# Patient Record
Sex: Female | Born: 1960 | Race: White | Hispanic: No | Smoking: Never smoker
Health system: Southern US, Community
[De-identification: ages and names within clinical notes are randomized; demographics above are authoritative.]

## PROBLEM LIST (undated history)

## (undated) HISTORY — PX: ABDOMINAL HYSTERECTOMY: SHX81

## (undated) HISTORY — PX: WRIST SURGERY: SHX841

---

## 2009-05-16 ENCOUNTER — Inpatient Hospital Stay (HOSPITAL_COMMUNITY): Admission: EM | Admit: 2009-05-16 | Discharge: 2009-05-19 | Payer: Self-pay | Admitting: Emergency Medicine

## 2009-06-28 ENCOUNTER — Encounter: Admission: RE | Admit: 2009-06-28 | Discharge: 2009-09-26 | Payer: Self-pay | Admitting: Orthopedic Surgery

## 2010-07-16 LAB — CBC
HCT: 38.1 % (ref 36.0–46.0)
MCHC: 34.9 g/dL (ref 30.0–36.0)
Platelets: 278 10*3/uL (ref 150–400)
RDW: 12 % (ref 11.5–15.5)

## 2010-07-16 LAB — DIFFERENTIAL
Basophils Relative: 0 % (ref 0–1)
Eosinophils Absolute: 0 10*3/uL (ref 0.0–0.7)
Eosinophils Relative: 0 % (ref 0–5)
Lymphocytes Relative: 11 % — ABNORMAL LOW (ref 12–46)
Lymphs Abs: 0.9 10*3/uL (ref 0.7–4.0)
Monocytes Absolute: 0.3 10*3/uL (ref 0.1–1.0)
Monocytes Relative: 4 % (ref 3–12)
Neutro Abs: 6.9 10*3/uL (ref 1.7–7.7)

## 2010-07-16 LAB — BASIC METABOLIC PANEL
GFR calc Af Amer: >60 mL/min (ref >60)
Glucose, Bld: 112 mg/dL — ABNORMAL HIGH (ref 70–99)
Sodium: 138 mEq/L (ref 135–145)

## 2010-07-16 LAB — URINALYSIS, ROUTINE W REFLEX MICROSCOPIC
Protein, ur: NEGATIVE mg/dL
Urobilinogen, UA: 0.2 mg/dL (ref 0.0–1.0)

## 2011-02-16 IMAGING — CR DG CHEST 2V
2 series · 2 of 2 positions shown · non-contrast
Comparison: None

CLINICAL DATA: Wrist fracture.  Preop respiratory exam.

CHEST - 2 VIEW

[w chest pa]
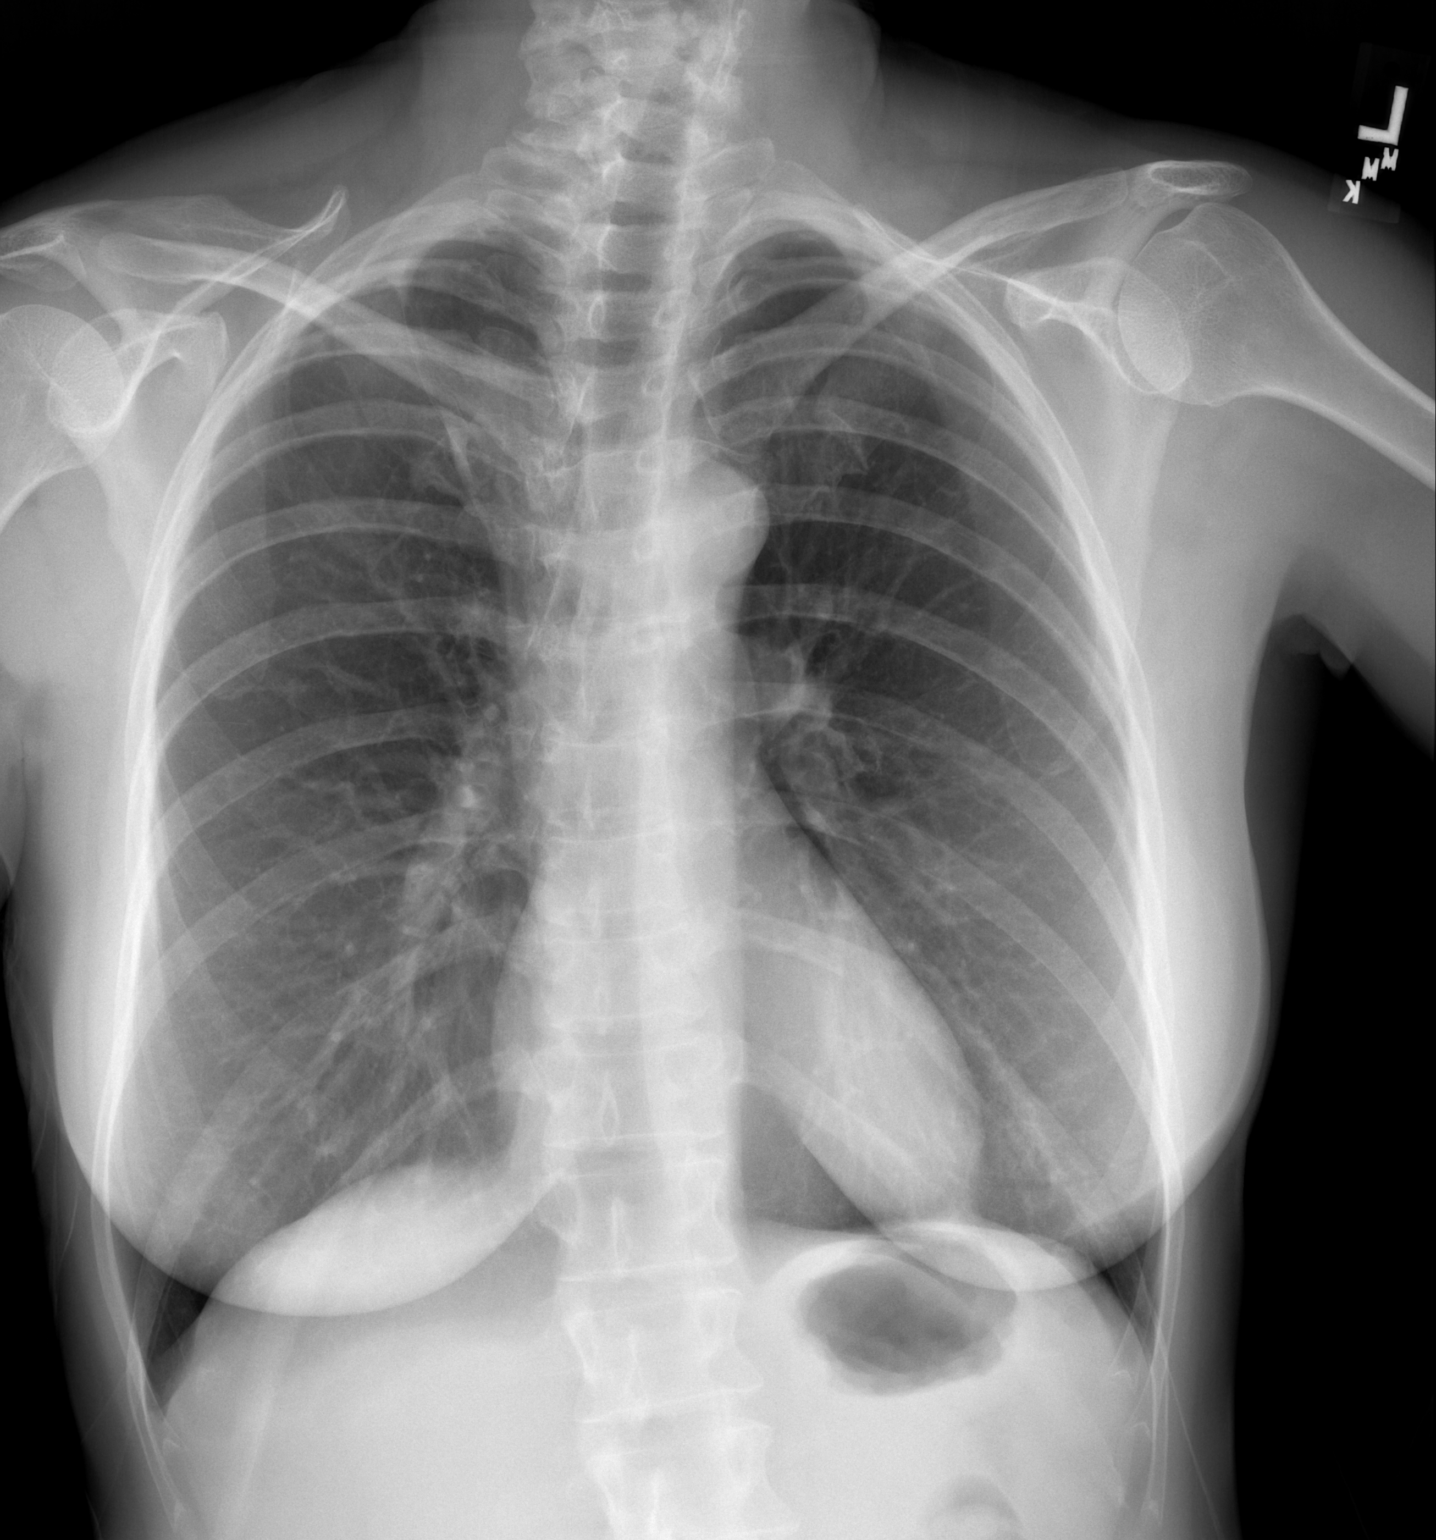

[w chest lat]
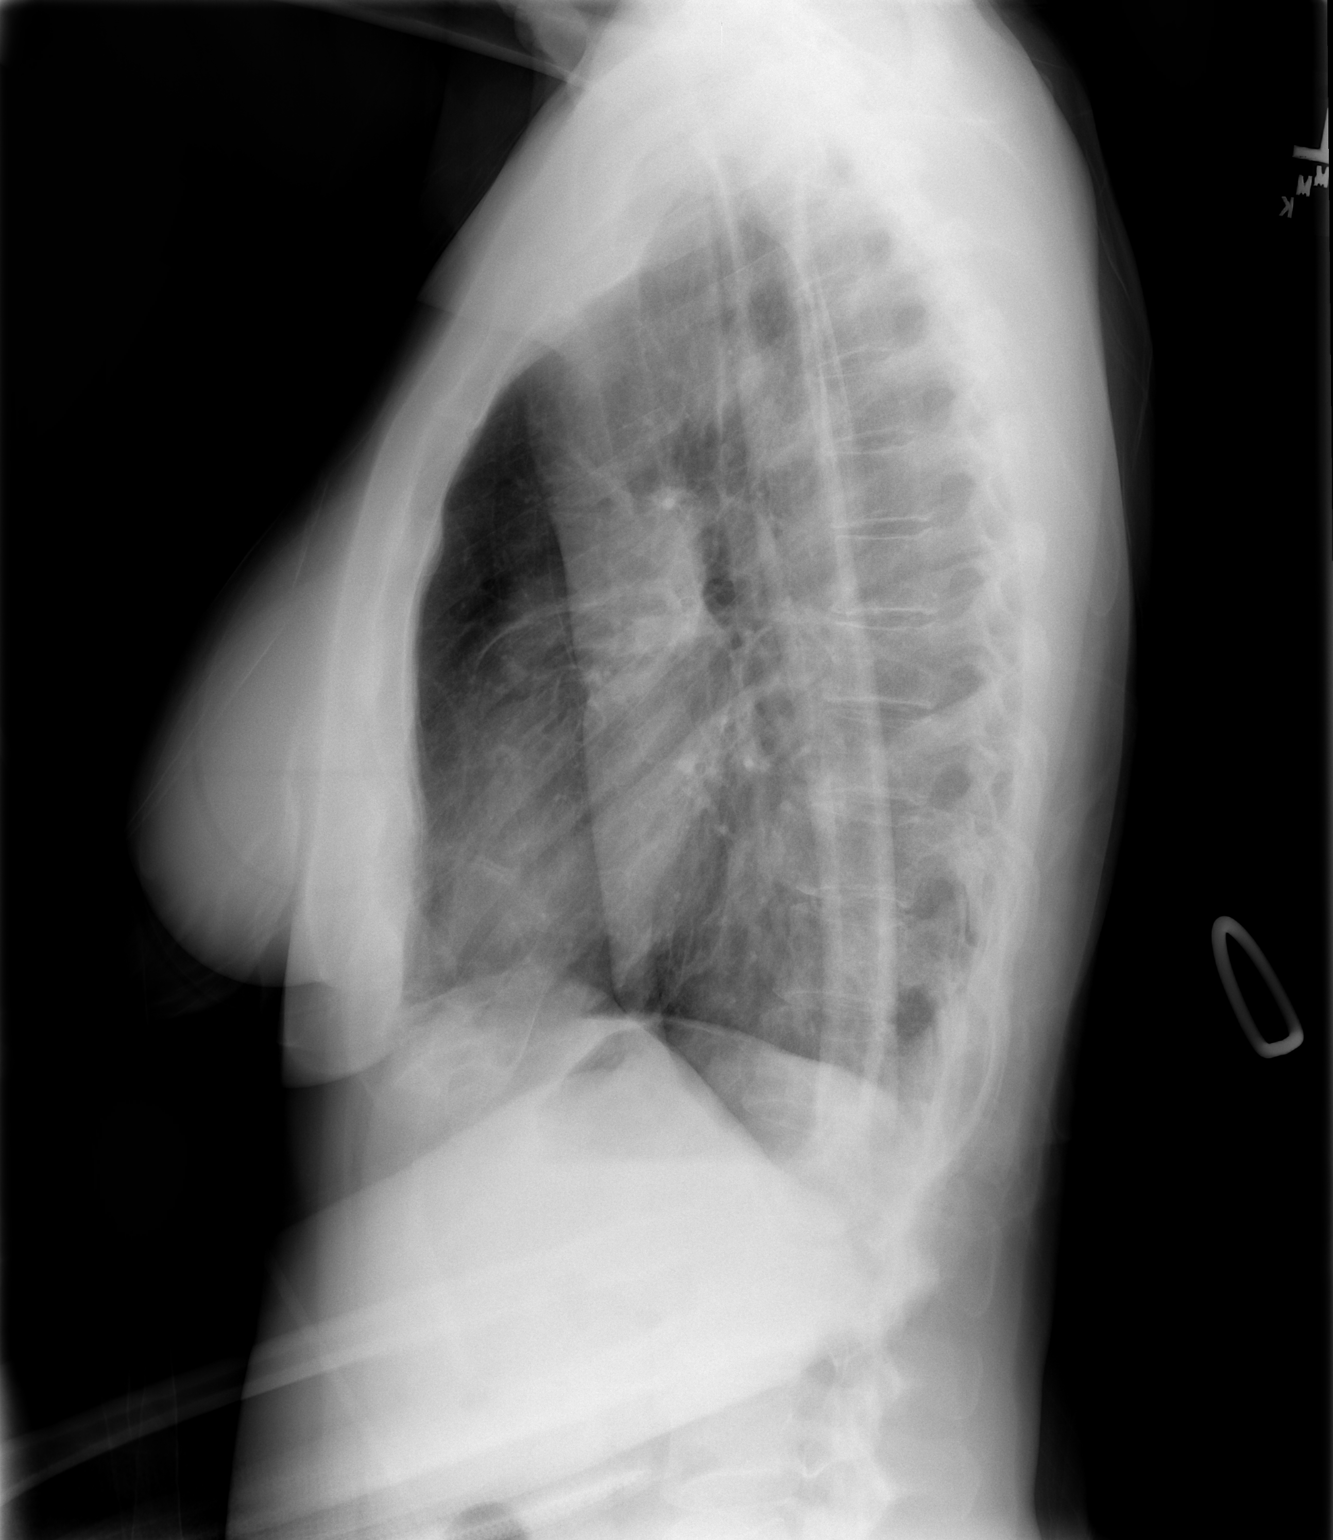

[2 of 2 positions shown; findings below may reference images not displayed]

FINDINGS: Heart size mediastinal contours are normal.  Both lungs
are clear.  Incidental note is made of an azygos fissure.  No
evidence of pneumothorax or pleural effusion.
IMPRESSION: No active cardiopulmonary disease.

## 2013-06-24 ENCOUNTER — Ambulatory Visit (INDEPENDENT_AMBULATORY_CARE_PROVIDER_SITE_OTHER): Payer: 59 | Admitting: Podiatry

## 2013-06-24 ENCOUNTER — Ambulatory Visit (INDEPENDENT_AMBULATORY_CARE_PROVIDER_SITE_OTHER): Payer: 59

## 2013-06-24 ENCOUNTER — Encounter: Payer: Self-pay | Admitting: Podiatry

## 2013-06-24 VITALS — BP 131/88 | HR 92 | Resp 16 | Ht 63.0 in | Wt 112.0 lb

## 2013-06-24 DIAGNOSIS — M2020 Hallux rigidus, unspecified foot: Secondary | ICD-10-CM

## 2013-06-24 DIAGNOSIS — M2010 Hallux valgus (acquired), unspecified foot: Secondary | ICD-10-CM

## 2013-06-24 NOTE — Progress Notes (Signed)
   Subjective:    Patient ID: Michele LaddBetty Salsgiver, female    DOB: 14-Jan-1961, 53 y.o.   MRN: [id number]  HPI Comments: "I have a painful bunion"  Patient c/o painful 1st MPJ bilateral, right over left, for several years. Just recently the right has become worse. The area is red and swells some. 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 Her shoes are sometimes uncomfortable. She has tried ice and Advil.  Foot Pain      Review of Systems  All other systems reviewed and are negative.       Objective:   Physical Exam        Assessment & Plan:

## 2013-06-24 NOTE — Patient Instructions (Signed)

## 2013-06-25 NOTE — Progress Notes (Signed)
Subjective:     Patient ID: Michele Hardy, female   DOB: 07-11-1960, 53 y.o.   MRN: [id number]  Foot Pain   patient presents with complaint of painful bunion deformity right over left foot it is becoming increasingly red and sore. 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 Has had trouble with her foot for a fairly long time and it has worsened recently right over left   Review of Systems  All other systems reviewed and are negative.       Objective:   Physical Exam  Nursing note and vitals reviewed. Constitutional: She is oriented to person, place, and time.  Musculoskeletal: Normal range of motion.  Neurological: She is oriented to person, place, and time.  Skin: Skin is warm.   neurovascular status is intact with normal muscle strength range of motion and no equinus condition noted. Toes are warm and structure shows bunion deformity right over left was swelling around the right first MPJ and dorsal pain upon palpation. 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 I explained to the patient the condition that she has and the discomfort she is experiencing is and discussed the different treatment options. Patient wants surgery for this and we discussed a biplanar Austin bunionectomy with removal of bone spurs and possible subchondral bone drilling. I did explain the possibilities for joint fusion or implantation in the future and that she does have a progressive type condition. She is completely aware of this and wants surgery and at this time was given consent form that she read [facility] going over all possible complications and the fact that total recovery. Take 6 months to one year. Patient  was dispensed air fracture walker to use during the postoperative process

## 2013-06-29 ENCOUNTER — Encounter: Payer: Self-pay | Admitting: Podiatry

## 2013-06-29 DIAGNOSIS — M2020 Hallux rigidus, unspecified foot: Secondary | ICD-10-CM

## 2013-07-05 ENCOUNTER — Ambulatory Visit (INDEPENDENT_AMBULATORY_CARE_PROVIDER_SITE_OTHER): Payer: 59 | Admitting: Podiatry

## 2013-07-05 ENCOUNTER — Ambulatory Visit (INDEPENDENT_AMBULATORY_CARE_PROVIDER_SITE_OTHER): Payer: 59

## 2013-07-05 ENCOUNTER — Encounter: Payer: Self-pay | Admitting: Podiatry

## 2013-07-05 VITALS — BP 130/88 | HR 92 | Resp 16

## 2013-07-05 DIAGNOSIS — Z9889 Other specified postprocedural states: Secondary | ICD-10-CM

## 2013-07-05 DIAGNOSIS — M2020 Hallux rigidus, unspecified foot: Secondary | ICD-10-CM

## 2013-07-05 NOTE — Progress Notes (Signed)
Subjective:     Patient ID: Michele Hardy, female   DOB: [birth year], 53 y.o.   MRN: [id number]  HPI patient states I'm having some pain around the big toe joint right but overall it feels okay and I'm very happy with my surgery. 6 days after having biplanar osteotomy first metatarsal right   Review of Systems     Objective:   Physical Exam Neurovascular status intact negative Homans sign noted. The first MPJ right is doing very well with mild edema no erythema no drainage wound edges well coapted and good dorsi and plantar flexion    Assessment:     Patient is doing well post biplanar osteotomy first metatarsal right    Plan:     Reviewed x-rays and dispensed surgical shoe. Allow patient to get foot wet without putting pressure on it and explained range of motion exercises. Reappoint in 3 weeks for recheck or earlier if needed

## 2013-07-05 NOTE — Progress Notes (Unsigned)
Surgery performed by Dr. Charlsie Merlesegal - right bi-planar Eliberto IvoryAustin bunionectomy with pin fixation.

## 2013-08-02 ENCOUNTER — Encounter: Payer: 59 | Admitting: Podiatry

## 2013-08-12 ENCOUNTER — Ambulatory Visit (INDEPENDENT_AMBULATORY_CARE_PROVIDER_SITE_OTHER): Payer: 59 | Admitting: Podiatry

## 2013-08-12 ENCOUNTER — Ambulatory Visit (INDEPENDENT_AMBULATORY_CARE_PROVIDER_SITE_OTHER): Payer: 59

## 2013-08-12 ENCOUNTER — Encounter: Payer: Self-pay | Admitting: Podiatry

## 2013-08-12 VITALS — BP 158/82 | HR 74 | Resp 16

## 2013-08-12 DIAGNOSIS — M2010 Hallux valgus (acquired), unspecified foot: Secondary | ICD-10-CM

## 2013-08-13 NOTE — Progress Notes (Signed)
Subjective:     Patient ID: Michele LaddBetty Wolk, female   DOB: Jul 09, 1960, 53 y.o.   MRN: [id number]  HPI patient states that she is doing very well with her surgery and satisfied with the correction and the fact that she can walk with minimal swelling   Review of Systems     Objective:   Physical Exam Neurovascular status intact negative Homans sign noted with well-healing surgical site right first metatarsal with good alignment noted and good range of motion with 30 dorsiflexion 20 plantar flexion with no pain or crepitus within the joint    Assessment:     Doing well from structural surgery of the right foot with mild edema at    Plan:     Reviewed x-ray explained that mild edema still normal for this type of surgery and allow patient to increase activities and shoe gear to toleration and reappoint 8 weeks earlier if any issues should occur

## 2013-09-23 ENCOUNTER — Ambulatory Visit (INDEPENDENT_AMBULATORY_CARE_PROVIDER_SITE_OTHER): Payer: 59 | Admitting: Podiatry

## 2013-09-23 ENCOUNTER — Ambulatory Visit (INDEPENDENT_AMBULATORY_CARE_PROVIDER_SITE_OTHER): Payer: 59

## 2013-09-23 ENCOUNTER — Encounter: Payer: Self-pay | Admitting: Podiatry

## 2013-09-23 VITALS — BP 136/89 | HR 79 | Resp 14 | Ht 63.0 in | Wt 110.0 lb

## 2013-09-23 DIAGNOSIS — M21619 Bunion of unspecified foot: Secondary | ICD-10-CM

## 2013-09-23 DIAGNOSIS — M2020 Hallux rigidus, unspecified foot: Secondary | ICD-10-CM

## 2013-09-23 NOTE — Progress Notes (Signed)
Subjective:     Patient ID: Michele Hardy, female   DOB: 1960-11-22, 53 y.o.   MRN: [id number]  HPI patient states I'm doing really well with my right foot   Review of Systems     Objective:   Physical Exam Neurovascular status intact with well-healing surgical site right approximate 8 weeks postop biplanar osteotomy first metatarsal    Assessment:     Doing well after having Austin osteotomy right foot    Plan:     Advised on physical therapy and to return to normal activity reviewed x-rays and reappoint as needed

## 2014-02-11 ENCOUNTER — Other Ambulatory Visit: Payer: Self-pay

## 2018-02-25 DIAGNOSIS — Z23 Encounter for immunization: Secondary | ICD-10-CM | POA: Diagnosis not present

## 2018-04-02 DIAGNOSIS — Z1159 Encounter for screening for other viral diseases: Secondary | ICD-10-CM | POA: Diagnosis not present

## 2018-04-02 DIAGNOSIS — Z13220 Encounter for screening for lipoid disorders: Secondary | ICD-10-CM | POA: Diagnosis not present

## 2018-04-02 DIAGNOSIS — Z0000 Encounter for general adult medical examination without abnormal findings: Secondary | ICD-10-CM | POA: Diagnosis not present

## 2018-05-01 DIAGNOSIS — M8589 Other specified disorders of bone density and structure, multiple sites: Secondary | ICD-10-CM | POA: Diagnosis not present

## 2018-05-01 DIAGNOSIS — Z90710 Acquired absence of both cervix and uterus: Secondary | ICD-10-CM | POA: Diagnosis not present

## 2018-05-01 DIAGNOSIS — Z780 Asymptomatic menopausal state: Secondary | ICD-10-CM | POA: Diagnosis not present

## 2018-05-01 DIAGNOSIS — Z1231 Encounter for screening mammogram for malignant neoplasm of breast: Secondary | ICD-10-CM | POA: Diagnosis not present

## 2018-05-01 DIAGNOSIS — Z8262 Family history of osteoporosis: Secondary | ICD-10-CM | POA: Diagnosis not present

## 2018-07-06 DIAGNOSIS — D649 Anemia, unspecified: Secondary | ICD-10-CM | POA: Diagnosis not present

## 2018-10-12 DIAGNOSIS — D649 Anemia, unspecified: Secondary | ICD-10-CM | POA: Diagnosis not present

## 2019-02-01 ENCOUNTER — Other Ambulatory Visit: Payer: Self-pay | Admitting: *Deleted

## 2019-02-01 ENCOUNTER — Telehealth: Payer: Self-pay | Admitting: Nurse Practitioner

## 2019-02-01 DIAGNOSIS — Z20822 Contact with and (suspected) exposure to covid-19: Secondary | ICD-10-CM

## 2019-02-01 DIAGNOSIS — R05 Cough: Secondary | ICD-10-CM

## 2019-02-01 DIAGNOSIS — Z20828 Contact with and (suspected) exposure to other viral communicable diseases: Secondary | ICD-10-CM

## 2019-02-01 DIAGNOSIS — R059 Cough, unspecified: Secondary | ICD-10-CM

## 2019-02-01 MED ORDER — BENZONATATE 100 MG PO CAPS: 100.0000 mg | ORAL_CAPSULE | Freq: Three times a day (TID) | ORAL | 0 refills | Status: DC | PRN

## 2019-02-01 NOTE — Progress Notes (Signed)
E-Visit for Corona Virus Screening   Your current symptoms could be consistent with the coronavirus.  Many health care providers can now test patients at their office but not all are.  [facility] has multiple testing sites. For information on our COVID testing locations and hours go to [email or web address]  Please quarantine yourself while awaiting your test results.  We are enrolling you in our MyChart Home [facility] for COVID19 . Daily you will receive a questionnaire within the MyChart website. Our COVID 19 response team willl be monitoriing your responses daily.  You can go to one of the  testing sites listed below, while they are opened (see hours). You do not need a doctors order to be tested for covid.You do need to self-isolate until your results return and if positive 14 days from when your symptoms started and until you are 3 days symptom free.   Testing Locations (Monday - Friday, 8 a.m. - 3:30 p.m.) . [place]: [facility], [address], [place], [place]  . [place]: [facility], [address], [place], [place] (entrance off [address])  . [place]: [address], [place], [place] (across from [place] [facility])    COVID-19 is a respiratory illness with symptoms that are similar to the flu. Symptoms are typically mild to moderate, but there have been cases of severe illness and death due to the virus. The following symptoms may appear 2-14 days after exposure: . Fever . Cough . Shortness of breath or difficulty breathing . Chills . Repeated shaking with chills . Muscle pain . Headache . Sore throat . New loss of taste or smell . Fatigue . Congestion or runny nose . Nausea or vomiting . Diarrhea  It is vitally important that if you feel that you have an infection such as this virus or any other virus that you stay home and away from places where you may  spread it to others.  You should self-quarantine for 14 days if you have symptoms that could potentially be coronavirus or have been in close contact a with a person diagnosed with COVID-19 within the last 2 weeks. You should avoid contact with people age 65 and older.   You should wear a mask or cloth face covering over your nose and mouth if you must be around other people or animals, including pets (even at home). Try to stay at least 6 feet away from other people. This will protect the people around you.  You can use medication such as A prescription cough medication called Tessalon Perles 100 mg. You may take 1-2 capsules every 8 hours as needed for cough  You may also take acetaminophen (Tylenol) as needed for fever.   Reduce your risk of any infection by using the same precautions used for avoiding the common cold or flu:  . Wash your hands often with soap and warm water for at least 20 seconds.  If soap and water are not readily available, use an alcohol-based hand sanitizer with at least 60% alcohol.  . If coughing or sneezing, cover your mouth and nose by coughing or sneezing into the elbow areas of your shirt or coat, into a tissue or into your sleeve (not your hands). . Avoid shaking hands with others and consider head nods or verbal greetings only. . Avoid touching your eyes, nose, or mouth with unwashed hands.  . Avoid close contact with people who are sick. . Avoid places or   events with large numbers of people in one location, like concerts or sporting events. . Carefully consider travel plans you have or are making. . If you are planning any travel outside or inside the [place], visit the CDC's Travelers' Health webpage for the latest health notices. . If you have some symptoms but not all symptoms, continue to monitor at home and seek medical attention if your symptoms worsen. . If you are having a medical emergency, call 911.  HOME CARE . Only take medications as instructed by your  medical team. . Drink plenty of fluids and get plenty of rest. . A steam or ultrasonic humidifier can help if you have congestion.   GET HELP RIGHT AWAY IF YOU HAVE EMERGENCY WARNING SIGNS** FOR COVID-19. If you or someone is showing any of these signs seek emergency medical care immediately. Call 911 or proceed to your closest emergency facility if: . You develop worsening high fever. . Trouble breathing . Bluish lips or face . Persistent pain or pressure in the chest . New confusion . Inability to wake or stay awake . You cough up blood. . Your symptoms become more severe  **This list is not all possible symptoms. Contact your medical provider for any symptoms that are sever or concerning to you.   MAKE SURE YOU   Understand these instructions.  Will watch your condition.  Will get help right away if you are not doing well or get worse.  Your e-visit answers were reviewed by a board certified advanced clinical practitioner to complete your personal care plan.  Depending on the condition, your plan could have included both over the counter or prescription medications.  If there is a problem please reply once you have received a response from your provider.  Your safety is important to us.  If you have drug allergies check your prescription carefully.    You can use MyChart to ask questions about today's visit, request a non-urgent call back, or ask for a work or school excuse for 24 hours related to this e-Visit. If it has been greater than 24 hours you will need to follow up with your provider, or enter a new e-Visit to address those concerns. You will get an e-mail in the next two days asking about your experience.  I hope that your e-visit has been valuable and will speed your recovery. Thank you for using e-visits.   5-10 minutes spent reviewing and documenting in chart.  

## 2019-02-02 ENCOUNTER — Encounter (INDEPENDENT_AMBULATORY_CARE_PROVIDER_SITE_OTHER): Payer: Self-pay

## 2019-02-02 LAB — NOVEL CORONAVIRUS, NAA: SARS-CoV-2, NAA: NOT DETECTED

## 2019-02-03 ENCOUNTER — Encounter (INDEPENDENT_AMBULATORY_CARE_PROVIDER_SITE_OTHER): Payer: Self-pay

## 2019-02-04 ENCOUNTER — Encounter (INDEPENDENT_AMBULATORY_CARE_PROVIDER_SITE_OTHER): Payer: Self-pay

## 2019-02-05 ENCOUNTER — Encounter (INDEPENDENT_AMBULATORY_CARE_PROVIDER_SITE_OTHER): Payer: Self-pay

## 2019-02-06 ENCOUNTER — Encounter (INDEPENDENT_AMBULATORY_CARE_PROVIDER_SITE_OTHER): Payer: Self-pay

## 2019-02-07 ENCOUNTER — Encounter (INDEPENDENT_AMBULATORY_CARE_PROVIDER_SITE_OTHER): Payer: Self-pay

## 2019-02-08 ENCOUNTER — Encounter (INDEPENDENT_AMBULATORY_CARE_PROVIDER_SITE_OTHER): Payer: Self-pay

## 2019-02-09 ENCOUNTER — Encounter (INDEPENDENT_AMBULATORY_CARE_PROVIDER_SITE_OTHER): Payer: Self-pay

## 2019-02-10 ENCOUNTER — Encounter (INDEPENDENT_AMBULATORY_CARE_PROVIDER_SITE_OTHER): Payer: Self-pay

## 2019-02-11 ENCOUNTER — Encounter (INDEPENDENT_AMBULATORY_CARE_PROVIDER_SITE_OTHER): Payer: Self-pay

## 2019-02-12 ENCOUNTER — Encounter (INDEPENDENT_AMBULATORY_CARE_PROVIDER_SITE_OTHER): Payer: Self-pay

## 2019-02-14 ENCOUNTER — Encounter (INDEPENDENT_AMBULATORY_CARE_PROVIDER_SITE_OTHER): Payer: Self-pay

## 2019-05-04 DIAGNOSIS — Z0000 Encounter for general adult medical examination without abnormal findings: Secondary | ICD-10-CM | POA: Diagnosis not present

## 2019-05-04 DIAGNOSIS — Z13220 Encounter for screening for lipoid disorders: Secondary | ICD-10-CM | POA: Diagnosis not present

## 2019-05-14 DIAGNOSIS — Z1231 Encounter for screening mammogram for malignant neoplasm of breast: Secondary | ICD-10-CM | POA: Diagnosis not present

## 2020-05-19 DIAGNOSIS — Z1231 Encounter for screening mammogram for malignant neoplasm of breast: Secondary | ICD-10-CM | POA: Diagnosis not present

## 2020-07-03 DIAGNOSIS — L570 Actinic keratosis: Secondary | ICD-10-CM | POA: Diagnosis not present

## 2020-07-03 DIAGNOSIS — L400 Psoriasis vulgaris: Secondary | ICD-10-CM | POA: Diagnosis not present

## 2020-07-03 DIAGNOSIS — C44519 Basal cell carcinoma of skin of other part of trunk: Secondary | ICD-10-CM | POA: Diagnosis not present

## 2020-07-03 DIAGNOSIS — Z85828 Personal history of other malignant neoplasm of skin: Secondary | ICD-10-CM | POA: Diagnosis not present

## 2020-07-03 DIAGNOSIS — D485 Neoplasm of uncertain behavior of skin: Secondary | ICD-10-CM | POA: Diagnosis not present

## 2020-07-03 DIAGNOSIS — L905 Scar conditions and fibrosis of skin: Secondary | ICD-10-CM | POA: Diagnosis not present

## 2020-07-03 DIAGNOSIS — C44311 Basal cell carcinoma of skin of nose: Secondary | ICD-10-CM | POA: Diagnosis not present

## 2020-07-25 DIAGNOSIS — Z13220 Encounter for screening for lipoid disorders: Secondary | ICD-10-CM | POA: Diagnosis not present

## 2020-07-25 DIAGNOSIS — Z23 Encounter for immunization: Secondary | ICD-10-CM | POA: Diagnosis not present

## 2020-07-25 DIAGNOSIS — Z0000 Encounter for general adult medical examination without abnormal findings: Secondary | ICD-10-CM | POA: Diagnosis not present

## 2020-08-14 DIAGNOSIS — C44519 Basal cell carcinoma of skin of other part of trunk: Secondary | ICD-10-CM | POA: Diagnosis not present

## 2020-08-14 DIAGNOSIS — L821 Other seborrheic keratosis: Secondary | ICD-10-CM | POA: Diagnosis not present

## 2020-08-14 DIAGNOSIS — L819 Disorder of pigmentation, unspecified: Secondary | ICD-10-CM | POA: Diagnosis not present

## 2020-08-14 DIAGNOSIS — L570 Actinic keratosis: Secondary | ICD-10-CM | POA: Diagnosis not present

## 2020-08-14 DIAGNOSIS — D485 Neoplasm of uncertain behavior of skin: Secondary | ICD-10-CM | POA: Diagnosis not present

## 2020-08-14 DIAGNOSIS — L820 Inflamed seborrheic keratosis: Secondary | ICD-10-CM | POA: Diagnosis not present

## 2020-08-14 DIAGNOSIS — L814 Other melanin hyperpigmentation: Secondary | ICD-10-CM | POA: Diagnosis not present

## 2020-08-14 DIAGNOSIS — C44619 Basal cell carcinoma of skin of left upper limb, including shoulder: Secondary | ICD-10-CM | POA: Diagnosis not present

## 2020-08-14 DIAGNOSIS — D1801 Hemangioma of skin and subcutaneous tissue: Secondary | ICD-10-CM | POA: Diagnosis not present

## 2020-09-14 DIAGNOSIS — L989 Disorder of the skin and subcutaneous tissue, unspecified: Secondary | ICD-10-CM | POA: Diagnosis not present

## 2020-09-14 DIAGNOSIS — C44319 Basal cell carcinoma of skin of other parts of face: Secondary | ICD-10-CM | POA: Diagnosis not present

## 2020-09-14 DIAGNOSIS — C44519 Basal cell carcinoma of skin of other part of trunk: Secondary | ICD-10-CM | POA: Diagnosis not present

## 2020-12-07 DIAGNOSIS — C44519 Basal cell carcinoma of skin of other part of trunk: Secondary | ICD-10-CM | POA: Diagnosis not present

## 2020-12-07 DIAGNOSIS — Z85828 Personal history of other malignant neoplasm of skin: Secondary | ICD-10-CM | POA: Diagnosis not present

## 2020-12-07 DIAGNOSIS — L905 Scar conditions and fibrosis of skin: Secondary | ICD-10-CM | POA: Diagnosis not present

## 2021-03-09 DIAGNOSIS — L814 Other melanin hyperpigmentation: Secondary | ICD-10-CM | POA: Diagnosis not present

## 2021-03-09 DIAGNOSIS — C44729 Squamous cell carcinoma of skin of left lower limb, including hip: Secondary | ICD-10-CM | POA: Diagnosis not present

## 2021-03-09 DIAGNOSIS — L308 Other specified dermatitis: Secondary | ICD-10-CM | POA: Diagnosis not present

## 2021-03-09 DIAGNOSIS — L821 Other seborrheic keratosis: Secondary | ICD-10-CM | POA: Diagnosis not present

## 2021-03-09 DIAGNOSIS — D225 Melanocytic nevi of trunk: Secondary | ICD-10-CM | POA: Diagnosis not present

## 2021-03-09 DIAGNOSIS — D485 Neoplasm of uncertain behavior of skin: Secondary | ICD-10-CM | POA: Diagnosis not present

## 2021-03-09 DIAGNOSIS — L570 Actinic keratosis: Secondary | ICD-10-CM | POA: Diagnosis not present

## 2021-05-25 DIAGNOSIS — M85852 Other specified disorders of bone density and structure, left thigh: Secondary | ICD-10-CM | POA: Diagnosis not present

## 2021-05-25 DIAGNOSIS — Z1231 Encounter for screening mammogram for malignant neoplasm of breast: Secondary | ICD-10-CM | POA: Diagnosis not present

## 2021-05-31 DIAGNOSIS — L989 Disorder of the skin and subcutaneous tissue, unspecified: Secondary | ICD-10-CM | POA: Diagnosis not present

## 2021-05-31 DIAGNOSIS — C44729 Squamous cell carcinoma of skin of left lower limb, including hip: Secondary | ICD-10-CM | POA: Diagnosis not present

## 2021-07-27 DIAGNOSIS — Z0000 Encounter for general adult medical examination without abnormal findings: Secondary | ICD-10-CM | POA: Diagnosis not present

## 2021-07-27 DIAGNOSIS — Z13220 Encounter for screening for lipoid disorders: Secondary | ICD-10-CM | POA: Diagnosis not present

## 2021-09-10 DIAGNOSIS — L570 Actinic keratosis: Secondary | ICD-10-CM | POA: Diagnosis not present

## 2021-09-10 DIAGNOSIS — Z85828 Personal history of other malignant neoplasm of skin: Secondary | ICD-10-CM | POA: Diagnosis not present

## 2021-09-10 DIAGNOSIS — Z08 Encounter for follow-up examination after completed treatment for malignant neoplasm: Secondary | ICD-10-CM | POA: Diagnosis not present

## 2022-01-09 DIAGNOSIS — L570 Actinic keratosis: Secondary | ICD-10-CM | POA: Diagnosis not present

## 2022-01-09 DIAGNOSIS — L814 Other melanin hyperpigmentation: Secondary | ICD-10-CM | POA: Diagnosis not present

## 2022-01-09 DIAGNOSIS — L821 Other seborrheic keratosis: Secondary | ICD-10-CM | POA: Diagnosis not present

## 2022-03-11 DIAGNOSIS — L570 Actinic keratosis: Secondary | ICD-10-CM | POA: Diagnosis not present

## 2022-03-11 DIAGNOSIS — L578 Other skin changes due to chronic exposure to nonionizing radiation: Secondary | ICD-10-CM | POA: Diagnosis not present

## 2022-05-31 DIAGNOSIS — Z1231 Encounter for screening mammogram for malignant neoplasm of breast: Secondary | ICD-10-CM | POA: Diagnosis not present

## 2022-06-14 DIAGNOSIS — M1712 Unilateral primary osteoarthritis, left knee: Secondary | ICD-10-CM | POA: Diagnosis not present

## 2022-08-02 DIAGNOSIS — Z0000 Encounter for general adult medical examination without abnormal findings: Secondary | ICD-10-CM | POA: Diagnosis not present

## 2022-08-02 DIAGNOSIS — Z13220 Encounter for screening for lipoid disorders: Secondary | ICD-10-CM | POA: Diagnosis not present

## 2022-09-13 DIAGNOSIS — Z09 Encounter for follow-up examination after completed treatment for conditions other than malignant neoplasm: Secondary | ICD-10-CM | POA: Diagnosis not present

## 2022-09-13 DIAGNOSIS — L570 Actinic keratosis: Secondary | ICD-10-CM | POA: Diagnosis not present

## 2022-09-13 DIAGNOSIS — L578 Other skin changes due to chronic exposure to nonionizing radiation: Secondary | ICD-10-CM | POA: Diagnosis not present

## 2022-09-18 DIAGNOSIS — M67431 Ganglion, right wrist: Secondary | ICD-10-CM | POA: Diagnosis not present

## 2022-11-14 DIAGNOSIS — M67431 Ganglion, right wrist: Secondary | ICD-10-CM | POA: Diagnosis not present

## 2022-11-29 ENCOUNTER — Ambulatory Visit (INDEPENDENT_AMBULATORY_CARE_PROVIDER_SITE_OTHER): Payer: BC Managed Care – PPO

## 2022-11-29 ENCOUNTER — Ambulatory Visit: Payer: BC Managed Care – PPO | Admitting: Podiatry

## 2022-11-29 ENCOUNTER — Encounter: Payer: Self-pay | Admitting: Podiatry

## 2022-11-29 DIAGNOSIS — M79671 Pain in right foot: Secondary | ICD-10-CM

## 2022-11-29 DIAGNOSIS — M2012 Hallux valgus (acquired), left foot: Secondary | ICD-10-CM | POA: Diagnosis not present

## 2022-11-30 NOTE — Progress Notes (Signed)
Subjective:   Patient ID: Michele Hardy, female   DOB: 62 y.o.   MRN: [id number]   HPI Patient's not been seen in 9 years and had bunion surgery right done by me and is needed to get the left one done but the timing has not been right.  Is now able to get the left one fixed.  Patient does not smoke likes to be active this has been hurting for at least a year and she has tried shoe gear modification she has tried soaks and medication as needed   Review of Systems  All other systems reviewed and are negative.       Objective:  Physical Exam Vitals and nursing note reviewed.  Constitutional:      Appearance: She is well-developed.  Pulmonary:     Effort: Pulmonary effort is normal.  Musculoskeletal:        General: Normal range of motion.  Skin:    General: Skin is warm.  Neurological:     Mental Status: She is alert.     Neurovascular status intact muscle strength adequate range of motion within normal limits with patient found to have large hyperostosis medial aspect first metatarsal head left and also some limitation of motion of the first MPJ left with history of correction right that is done very well but does have limited motion.     Assessment:  Significant HAV deformity left with limited range of motion of the first MPJ with fluid buildup around the joint surface painful when pressed with moderate diminishment of motion     Plan:  Structural HAV deformity along with hallux limitus deformity of a mild nature with possibility for low-grade issues with the joint surface itself noted.  I reviewed this with the patient and I discussed at great length did H&P discussed treatment options she wants this fixed surgically and at this point I do think it for the best for her I allowed her to read consent form going over alternative treatments complications she is willing to accept risk wants surgery and after extensive review signed consent form for distal osteotomy and removal of dorsal  bone spur.  Patient understands there may be different damage to the cartilage could eventually require fusion or other surgery but I do think this will do well for her and after review she is scheduled for surgery with all questions answered.  Total recovery can take approximately 6 months and she is dispensed a air fracture walker boot today fitted properly to her lower leg with all questions answered concerning it  X-rays indicate structural elevation of the intermetatarsal angle left of approximately 15 degrees with small dorsal spurring and moderate limitation in narrowing of the joint on the lateral side.  Right does show good position good fixation no reoccurrence of spurs

## 2022-12-06 DIAGNOSIS — M67431 Ganglion, right wrist: Secondary | ICD-10-CM | POA: Diagnosis not present

## 2022-12-06 DIAGNOSIS — M67833 Other specified disorders of tendon, right wrist: Secondary | ICD-10-CM | POA: Diagnosis not present

## 2022-12-13 ENCOUNTER — Telehealth: Payer: Self-pay | Admitting: Urology

## 2022-12-13 NOTE — Telephone Encounter (Signed)
DOS - 01/14/23  [place] BUNIONECTOMY LEFT --- [id number]  [employer]  PER Posey Rea FOR CPT CODE 60454 HAS BEEN APPROVED, AUTH # [id number], GOOD FROM 01/14/23 - 03/14/23.

## 2023-01-13 MED ORDER — OXYCODONE-ACETAMINOPHEN 10-325 MG PO TABS: 1.0000 | ORAL_TABLET | ORAL | 0 refills | Status: AC | PRN

## 2023-01-13 MED ORDER — ONDANSETRON HCL 4 MG PO TABS: 4.0000 mg | ORAL_TABLET | Freq: Three times a day (TID) | ORAL | 0 refills | Status: AC | PRN

## 2023-01-13 NOTE — Addendum Note (Signed)
Addended by: Lenn Sink on: 01/13/2023 05:02 PM   Modules accepted: Orders

## 2023-01-14 DIAGNOSIS — M2012 Hallux valgus (acquired), left foot: Secondary | ICD-10-CM | POA: Diagnosis not present

## 2023-01-14 DIAGNOSIS — M21612 Bunion of left foot: Secondary | ICD-10-CM | POA: Diagnosis not present

## 2023-01-20 ENCOUNTER — Ambulatory Visit (INDEPENDENT_AMBULATORY_CARE_PROVIDER_SITE_OTHER): Payer: BC Managed Care – PPO | Admitting: Podiatry

## 2023-01-20 ENCOUNTER — Ambulatory Visit: Payer: BC Managed Care – PPO

## 2023-01-20 ENCOUNTER — Encounter: Payer: Self-pay | Admitting: Podiatry

## 2023-01-20 VITALS — Temp 98.0°F | Resp 18 | Ht 63.0 in | Wt 110.0 lb

## 2023-01-20 DIAGNOSIS — Z9889 Other specified postprocedural states: Secondary | ICD-10-CM

## 2023-01-20 DIAGNOSIS — M2012 Hallux valgus (acquired), left foot: Secondary | ICD-10-CM

## 2023-01-20 NOTE — Progress Notes (Signed)
Subjective:   Patient ID: Michele Hardy, female   DOB: 62 y.o.   MRN: [id number]   HPI Patient states doing great and very very happy so far   ROS      Objective:  Physical Exam  Neurovascular status intact negative Denna Haggard' sign noted left foot healing well wound edges well coapted range of motion good no crepitus of the joint and good structural position      Assessment:  Doing well post osteotomy left first metatarsal minimal discomfort excellent healing     Plan:  H&P x-rays reviewed and went ahead today and recommended continuation of surgical shoe and boot usage elevation compression and immobilization and will be seen back in the next 3 to 4 weeks and we will begin range of motion or earlier if needed  X-rays indicate satisfactory position first metatarsal spurs have been removed joint looks healthy

## 2023-01-27 ENCOUNTER — Other Ambulatory Visit: Payer: Self-pay | Admitting: Podiatry

## 2023-01-27 DIAGNOSIS — Z9889 Other specified postprocedural states: Secondary | ICD-10-CM

## 2023-01-27 DIAGNOSIS — M2012 Hallux valgus (acquired), left foot: Secondary | ICD-10-CM

## 2023-02-03 ENCOUNTER — Encounter: Payer: BC Managed Care – PPO | Admitting: Podiatry

## 2023-02-17 ENCOUNTER — Ambulatory Visit (INDEPENDENT_AMBULATORY_CARE_PROVIDER_SITE_OTHER): Payer: BC Managed Care – PPO

## 2023-02-17 ENCOUNTER — Encounter: Payer: Self-pay | Admitting: Podiatry

## 2023-02-17 ENCOUNTER — Ambulatory Visit (INDEPENDENT_AMBULATORY_CARE_PROVIDER_SITE_OTHER): Payer: BC Managed Care – PPO | Admitting: Podiatry

## 2023-02-17 DIAGNOSIS — M778 Other enthesopathies, not elsewhere classified: Secondary | ICD-10-CM

## 2023-02-17 DIAGNOSIS — M2012 Hallux valgus (acquired), left foot: Secondary | ICD-10-CM

## 2023-02-17 NOTE — Progress Notes (Signed)
Subjective:   Patient ID: Michele Hardy, female   DOB: 62 y.o.   MRN: [id number]   HPI Patient states doing very well with surgery very pleased   ROS      Objective:  Physical Exam  Neurovascular status intact negative Denna Haggard' sign noted wound edges coapted well hallux rectus position no crepitus of the joint     Assessment:  Doing well post osteotomy first metatarsal left     Plan:  Advised on the continuation of elevation as needed rigid bottom shoes and I went ahead today and I did dispense ankle compression stocking to keep swelling down  X-rays indicate osteotomy is healing well fixation in place joint congruence no movement

## 2023-03-14 DIAGNOSIS — D225 Melanocytic nevi of trunk: Secondary | ICD-10-CM | POA: Diagnosis not present

## 2023-03-14 DIAGNOSIS — L2089 Other atopic dermatitis: Secondary | ICD-10-CM | POA: Diagnosis not present

## 2023-03-14 DIAGNOSIS — L821 Other seborrheic keratosis: Secondary | ICD-10-CM | POA: Diagnosis not present

## 2023-03-14 DIAGNOSIS — L814 Other melanin hyperpigmentation: Secondary | ICD-10-CM | POA: Diagnosis not present

## 2023-06-13 DIAGNOSIS — Z1231 Encounter for screening mammogram for malignant neoplasm of breast: Secondary | ICD-10-CM | POA: Diagnosis not present

## 2023-08-08 DIAGNOSIS — Z13220 Encounter for screening for lipoid disorders: Secondary | ICD-10-CM | POA: Diagnosis not present

## 2023-08-08 DIAGNOSIS — Z0000 Encounter for general adult medical examination without abnormal findings: Secondary | ICD-10-CM | POA: Diagnosis not present

## 2024-01-12 DIAGNOSIS — Z1211 Encounter for screening for malignant neoplasm of colon: Secondary | ICD-10-CM | POA: Diagnosis not present

## 2024-01-12 DIAGNOSIS — D122 Benign neoplasm of ascending colon: Secondary | ICD-10-CM | POA: Diagnosis not present

## 2024-01-12 DIAGNOSIS — K5730 Diverticulosis of large intestine without perforation or abscess without bleeding: Secondary | ICD-10-CM | POA: Diagnosis not present

## 2024-01-14 DIAGNOSIS — D122 Benign neoplasm of ascending colon: Secondary | ICD-10-CM | POA: Diagnosis not present

## 2024-02-03 DIAGNOSIS — M5416 Radiculopathy, lumbar region: Secondary | ICD-10-CM | POA: Diagnosis not present

## 2024-02-03 DIAGNOSIS — M545 Low back pain, unspecified: Secondary | ICD-10-CM | POA: Diagnosis not present

## 2024-03-15 DIAGNOSIS — K056 Periodontal disease, unspecified: Secondary | ICD-10-CM | POA: Diagnosis not present

## 2024-03-19 DIAGNOSIS — L570 Actinic keratosis: Secondary | ICD-10-CM | POA: Diagnosis not present

## 2024-03-19 DIAGNOSIS — D1801 Hemangioma of skin and subcutaneous tissue: Secondary | ICD-10-CM | POA: Diagnosis not present

## 2024-03-19 DIAGNOSIS — L821 Other seborrheic keratosis: Secondary | ICD-10-CM | POA: Diagnosis not present

## 2024-03-19 DIAGNOSIS — L814 Other melanin hyperpigmentation: Secondary | ICD-10-CM | POA: Diagnosis not present

## 2024-03-19 DIAGNOSIS — L231 Allergic contact dermatitis due to adhesives: Secondary | ICD-10-CM | POA: Diagnosis not present

## 2024-03-29 DIAGNOSIS — H524 Presbyopia: Secondary | ICD-10-CM | POA: Diagnosis not present
